# Patient Record
Sex: Male | Born: 1965 | Race: White | Hispanic: No | Marital: Married | Smoking: Current every day smoker
Health system: Southern US, Academic
[De-identification: ages and names within clinical notes are randomized; demographics above are authoritative.]

## PROBLEM LIST (undated history)

## (undated) DIAGNOSIS — I219 Acute myocardial infarction, unspecified: Secondary | ICD-10-CM

## (undated) DIAGNOSIS — I2510 Atherosclerotic heart disease of native coronary artery without angina pectoris: Secondary | ICD-10-CM

## (undated) DIAGNOSIS — Z973 Presence of spectacles and contact lenses: Secondary | ICD-10-CM

## (undated) DIAGNOSIS — J449 Chronic obstructive pulmonary disease, unspecified: Secondary | ICD-10-CM

## (undated) DIAGNOSIS — E7800 Pure hypercholesterolemia, unspecified: Secondary | ICD-10-CM

## (undated) HISTORY — PX: CORONARY ARTERY ANGIOPLASTY: PR CATH30428

## (undated) HISTORY — PX: HX TONSILLECTOMY: SHX27

## (undated) HISTORY — PX: HX ADENOIDECTOMY: SHX29

---

## 2015-06-06 ENCOUNTER — Encounter (HOSPITAL_BASED_OUTPATIENT_CLINIC_OR_DEPARTMENT_OTHER): Payer: Self-pay

## 2015-06-06 ENCOUNTER — Emergency Department (HOSPITAL_BASED_OUTPATIENT_CLINIC_OR_DEPARTMENT_OTHER)
Admission: EM | Admit: 2015-06-06 | Discharge: 2015-06-06 | Disposition: A | Discharge status: Home / Self Care | Payer: No Typology Code available for payment source | Attending: Emergency Medicine | Admitting: Emergency Medicine

## 2015-06-06 DIAGNOSIS — F17210 Nicotine dependence, cigarettes, uncomplicated: Secondary | ICD-10-CM | POA: Insufficient documentation

## 2015-06-06 DIAGNOSIS — R040 Epistaxis: Secondary | ICD-10-CM | POA: Insufficient documentation

## 2015-06-06 DIAGNOSIS — Z7982 Long term (current) use of aspirin: Secondary | ICD-10-CM | POA: Insufficient documentation

## 2015-06-06 DIAGNOSIS — I2510 Atherosclerotic heart disease of native coronary artery without angina pectoris: Secondary | ICD-10-CM | POA: Insufficient documentation

## 2015-06-06 DIAGNOSIS — I252 Old myocardial infarction: Secondary | ICD-10-CM | POA: Insufficient documentation

## 2015-06-06 HISTORY — DX: Acute myocardial infarction, unspecified (CMS HCC): I21.9

## 2015-06-06 HISTORY — DX: Atherosclerotic heart disease of native coronary artery without angina pectoris: I25.10

## 2015-06-06 HISTORY — DX: Presence of spectacles and contact lenses: Z97.3

## 2015-06-06 LAB — CBC WITH DIFF
BASOPHIL #: 0.1 x10ˆ3/uL (ref 0.00–0.10)
BASOPHIL %: 1 % (ref 0–3)
EOSINOPHIL #: 0.2 x10ˆ3/uL (ref 0.00–0.50)
EOSINOPHIL %: 2 % (ref 0–5)
HCT: 48.4 % (ref 40.0–50.0)
HGB: 16.6 g/dL (ref 13.5–18.0)
LYMPHOCYTE #: 2 x10ˆ3/uL (ref 1.00–4.80)
LYMPHOCYTE %: 22 % (ref 15–43)
MCH: 31.4 pg (ref 27.5–33.2)
MCHC: 34.3 g/dL (ref 32.0–36.0)
MCV: 91.5 fL (ref 82.0–97.0)
MONOCYTE #: 0.6 x10ˆ3/uL (ref 0.20–0.90)
MONOCYTE %: 6 % (ref 5–12)
MPV: 8.4 fL (ref 7.4–10.5)
NEUTROPHIL #: 6.2 x10?3/uL (ref 1.50–6.50)
NEUTROPHIL #: 6.2 x10ˆ3/uL (ref 1.50–6.50)
NEUTROPHIL %: 69 % (ref 43–76)
PLATELETS: 203 x10ˆ3/uL (ref 150–450)
RBC: 5.29 x10?6/uL (ref 4.40–5.80)
RBC: 5.29 x10ˆ6/uL (ref 4.40–5.80)
RDW: 13.1 % (ref 11.0–16.0)
WBC: 9 x10ˆ3/uL (ref 4.0–11.0)

## 2015-06-06 LAB — PT/INR
INR: 0.99
PROTHROMBIN TIME: 10.9 s (ref 9.4–12.5)

## 2015-06-06 LAB — PTT (PARTIAL THROMBOPLASTIN TIME): APTT: 33.9 s (ref 25.1–36.5)

## 2015-06-06 NOTE — ED Nurses Note (Signed)
Nose bleed since midnight, had one emesis of blood.

## 2015-06-06 NOTE — ED Nurses Note (Signed)
Patient discharged home with family.  AVS reviewed with patient/care giver.  A written copy of the AVS and discharge instructions was given to the patient/care giver.  Questions sufficiently answered as needed.  Patient/care giver encouraged to follow up with PCP as indicated.  In the event of an emergency, patient/care giver instructed to call 911 or go to the nearest emergency room.      Current Discharge Medication List      CONTINUE these medications - NO CHANGES were made during your visit.      Details   aspirin 81 mg Tablet, Delayed Release (E.C.)  Commonly known as:  ECOTRIN   81 mg, Oral, DAILY  Refills:  0

## 2015-06-06 NOTE — ED Provider Notes (Signed)
Collin Redden, PA-C  [facility]   Emergency Department  [address]  [place], [place] [address]    Date of service:  06/06/2015     CC:   Chief Complaint   Patient presents with    Epistaxis     Nose bleed since midnight, had one emesis of blood.         Date: 06/06/2015  Primary care provider: No Established Pcp  Means of arrival: private car  History obtained by: patient  History limited by: none    HPI  Collin Decker, date of birth [birth year],  is a 49 y.o. male who presents to the ED with c/o epistaxis.  Onset about midnight.  Intermittent since. Applied pressure.  States seems to be bleeding out of right side.  Associated blood running down back throat.  Did vomit blood about 6:00 AM.  No current nausea.  No additional vomiting.  No  dyspnea.  No known trauma.  No hx of prolonged bleeding.  Takes OTC ASA daily.  No PCP.  No prescribed meds.  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  Associated mild headache.    Denies dizziness, lightheadedness, chest pain, abdomen pain, difficulty breathing.  + smoker.         Review of Systems    The pertinent positives and negatives are as per HPI.  All other systems reviewed and are negative.    I reviewed and confirmed the patient's past medical history taken by the nurse or medical assistant  History:   Meds:   Current Outpatient Prescriptions   Medication Sig    aspirin (ECOTRIN) 81 mg Oral Tablet, Delayed Release (E.C.) Take 81 mg by mouth Once a day     PMH:    Past Medical History   Diagnosis Date    Wears glasses     Coronary artery disease     MI (myocardial infarction)          PSH:    Past Surgical History   Procedure Laterality Date    Coronary artery angioplasty      Hx tonsillectomy      Hx adenoidectomy           Social Hx:    History     Social History    Marital Status: Married     Spouse Name: N/A    Number of Children: N/A    Years of Education: N/A     Occupational History    Not on file.     Social History Main  Topics    Smoking status: Current Every Day Smoker -- 2.00 packs/day    Smokeless tobacco: Not on file    Alcohol Use: No    Drug Use: No    Sexual Activity: Not on file     Other Topics Concern    Not on file     Social History Narrative    No narrative on file     Family Hx: No family history on file.  Allergies: No Known Allergies    Above history reviewed with patient, changes are as documented.    Physical Exam   Nursing notes reviewed.    ED Triage Vitals   Enc Vitals Group      BP (Non-Invasive) 06/06/15 0814 149/88 mmHg      Heart Rate 06/06/15 0814 92      Respiratory Rate 06/06/15 0814 16      Temperature 06/06/15 0814  36.9 C (98.5 F)      Temp src --       SpO2-1 06/06/15 0814 96 %      Weight 06/06/15 0814 100.336 kg (221 lb 3.2 oz)      Height 06/06/15 0814 1.702 m (5\' 7" )      Head Cir --       Peak Flow --       Pain Score --       Pain Loc --       Pain Edu? --       Excl. in GC? --          Constitutional: NAD. Oriented.  Obese male  HEENT:   Head: Normocephalic and atraumatic.   Mouth/Throat: Oropharynx is clear and moist. Mild streaking of blood posterior pharynx.  No active drainage.    Ears: EAC and TM WNL  Nose: dried blood nares bilaterally.  No septal hematoma.  No signs of injury.  No active bleeding.    Eyes: EOMI, PERRL, no scleral icterus  Neck: Trachea midline. Neck supple. No adenopathy  Cardiovascular: RRR, No murmurs, rubs or gallops heard. Intact distal pulses. Cap refill < 2 secs  Pulmonary/Chest: BS equal bilaterally. No respiratory distress. No wheezes, rales or chest tenderness.   Abdominal: BS +. Abdomen soft, no tenderness, rebound or guarding.   No appreciable pulsatile mass            Musculoskeletal: No edema, tenderness or deformity.  Moves spine and all extremities well  Skin: warm and dry. No rash, erythema, pallor or cyanosis  Psychiatric: normal mood and affect. Behavior is normal. Normal insight.    Neurological:  Awake and alert. Normal facial symmetry and  speech.  Grossly intact.     Course  MDM      Plan:       Medical Records reviewed.  No signs of active bleeding.  Case discussed with Attending physician Dr. Herbie Drape   Will obtain the following labs/imaging    Orders Placed This Encounter    CBC/DIFF    PT/INR    PTT (PARTIAL THROMBOPLASTIN TIME)    CBC WITH DIFF       Results for orders placed or performed during the hospital encounter of 06/06/15 (from the past 12 hour(s))   PT/INR   Result Value Ref Range    PROTHROMBIN TIME 10.9 9.4-12.5 seconds    INR 0.99    PTT (PARTIAL THROMBOPLASTIN TIME)   Result Value Ref Range    APTT 33.9 25.1-36.5 seconds   CBC WITH DIFF   Result Value Ref Range    WBC 9.0 4.0-11.0 x103/uL    RBC 5.29 4.40-5.80 x106/uL    HGB 16.6 13.5-18.0 g/dL    HCT 16.1 09.6-04.5 %    MCV 91.5 82.0-97.0 fL    MCH 31.4 27.5-33.2 pg    MCHC 34.3 32.0-36.0 g/dL    RDW 40.9 81.1-91.4 %    PLATELETS 203 150-450 x103/uL    MPV 8.4 7.4-10.5 fL    NEUTROPHIL % 69 43-76 %    LYMPHOCYTE % 22 15-43 %    MONOCYTE % 6 5-12 %    EOSINOPHIL % 2 0-5 %    BASOPHIL % 1 0-3 %    NEUTROPHIL # 6.20 1.50-6.50 x103/uL    LYMPHOCYTE # 2.00 1.00-4.80 x103/uL    MONOCYTE # 0.60 0.20-0.90 x103/uL    EOSINOPHIL # 0.20 0.00-0.50 x103/uL    BASOPHIL # 0.10 0.00-0.10 x103/uL  All labs were reviewed.  No emergent/concerning findings    Radiographic Imaging:   n/a    Course:     Nursing notes reviewed      Filed Vitals:    06/06/15 0814 06/06/15 0930   BP: 149/88 125/77   Pulse: 92 84   Temp: 36.9 C (98.5 F)    Resp: 16 19   SpO2: 96% 95%     Re-exam - 9:30 AM -   Exam findings, tests and lab results discussed with patient.  No additional bleeding.  Denies n/v.  Feels fine overall.  All questions answered. Nose clamps and instructions provided for PRN use.  Pt is driving back to [place].      There are no emergent findings at this time.  The patient is deemed stable and suitable for discharge.      Discharge and medication instructions were discussed with the patient  and all questions were addressed.  The patient understands that they may return to the ED at any time for new or worsening symptoms.    Discharge Medication List as of 06/06/2015  9:40 AM          Clinical Impression:    1.  Acute epistaxis.      Disposition: Discharged  Condition  Stable     Follow up:   primary care physician in [facility]    Schedule an appointment as soon as possible for a visit  to establish care    [facility]  [facility]  [place] [address]  [phone]    If symptoms worsen

## 2015-06-06 NOTE — ED Nurses Note (Signed)
Specimen labeled at bedside, name and DOB verified by patient/family by inspecting labeled specimen. Specimen sent to LAB via tube system.

## 2017-03-07 ENCOUNTER — Emergency Department (HOSPITAL_COMMUNITY): Payer: Worker's Compensation

## 2017-03-07 ENCOUNTER — Encounter (HOSPITAL_COMMUNITY): Payer: Self-pay | Admitting: Emergency Medicine

## 2017-03-07 ENCOUNTER — Emergency Department (HOSPITAL_COMMUNITY)
Admission: EM | Admit: 2017-03-07 | Discharge: 2017-03-07 | Disposition: A | Discharge status: Home / Self Care | Payer: Worker's Compensation | Attending: Emergency Medicine | Admitting: Emergency Medicine

## 2017-03-07 DIAGNOSIS — I252 Old myocardial infarction: Secondary | ICD-10-CM | POA: Insufficient documentation

## 2017-03-07 DIAGNOSIS — Y939 Activity, unspecified: Secondary | ICD-10-CM | POA: Diagnosis not present

## 2017-03-07 DIAGNOSIS — S80211A Abrasion, right knee, initial encounter: Secondary | ICD-10-CM | POA: Insufficient documentation

## 2017-03-07 DIAGNOSIS — J449 Chronic obstructive pulmonary disease, unspecified: Secondary | ICD-10-CM | POA: Diagnosis not present

## 2017-03-07 DIAGNOSIS — Y999 Unspecified external cause status: Secondary | ICD-10-CM | POA: Insufficient documentation

## 2017-03-07 DIAGNOSIS — S50311A Abrasion of right elbow, initial encounter: Secondary | ICD-10-CM | POA: Insufficient documentation

## 2017-03-07 DIAGNOSIS — Z79899 Other long term (current) drug therapy: Secondary | ICD-10-CM | POA: Diagnosis not present

## 2017-03-07 DIAGNOSIS — S0081XA Abrasion of other part of head, initial encounter: Secondary | ICD-10-CM | POA: Insufficient documentation

## 2017-03-07 DIAGNOSIS — Z87891 Personal history of nicotine dependence: Secondary | ICD-10-CM | POA: Diagnosis not present

## 2017-03-07 DIAGNOSIS — S0990XA Unspecified injury of head, initial encounter: Secondary | ICD-10-CM | POA: Diagnosis present

## 2017-03-07 DIAGNOSIS — Y92410 Unspecified street and highway as the place of occurrence of the external cause: Secondary | ICD-10-CM | POA: Insufficient documentation

## 2017-03-07 HISTORY — DX: Chronic obstructive pulmonary disease, unspecified: J44.9

## 2017-03-07 HISTORY — DX: Pure hypercholesterolemia, unspecified: E78.00

## 2017-03-07 HISTORY — DX: Acute myocardial infarction, unspecified: I21.9

## 2017-03-07 LAB — CBC WITH DIFFERENTIAL/PLATELET
BASOS PCT: 0 %
Basophils Absolute: 0 10*3/uL (ref 0.0–0.1)
EOS ABS: 0.2 10*3/uL (ref 0.0–0.7)
EOS PCT: 3 %
HEMATOCRIT: 46.7 % (ref 39.0–52.0)
HEMOGLOBIN: 15.3 g/dL (ref 13.0–17.0)
LYMPHS ABS: 1.7 10*3/uL (ref 0.7–4.0)
Lymphocytes Relative: 27 %
MCH: 30.1 pg (ref 26.0–34.0)
MCHC: 32.8 g/dL (ref 30.0–36.0)
MCV: 91.7 fL (ref 78.0–100.0)
Monocytes Absolute: 0.4 10*3/uL (ref 0.1–1.0)
Monocytes Relative: 6 %
NEUTROS PCT: 64 %
Neutro Abs: 3.9 10*3/uL (ref 1.7–7.7)
PLATELETS: 196 10*3/uL (ref 150–400)
RBC: 5.09 MIL/uL (ref 4.22–5.81)
RDW: 13.1 % (ref 11.5–15.5)
WBC: 6.1 10*3/uL (ref 4.0–10.5)

## 2017-03-07 LAB — BASIC METABOLIC PANEL
Anion gap: 11 (ref 5–15)
BUN: 14 mg/dL (ref 6–20)
CALCIUM: 9 mg/dL (ref 8.9–10.3)
CO2: 23 mmol/L (ref 22–32)
CREATININE: 1.21 mg/dL (ref 0.61–1.24)
Chloride: 102 mmol/L (ref 101–111)
GFR calc Af Amer: >60 mL/min (ref >60)
GFR calc non Af Amer: >60 mL/min (ref >60)
GLUCOSE: 124 mg/dL — AB (ref 65–99)
Potassium: 3.9 mmol/L (ref 3.5–5.1)
Sodium: 136 mmol/L (ref 135–145)

## 2017-03-07 MED ORDER — MORPHINE SULFATE (PF) 4 MG/ML IV SOLN
6.0000 mg | Freq: Once | INTRAVENOUS | Status: AC
  Administered 2017-03-07: 6 mg via INTRAVENOUS
  Filled 2017-03-07: qty 2

## 2017-03-07 MED ORDER — SODIUM CHLORIDE 0.9 % IV BOLUS (SEPSIS)
1000.0000 mL | Freq: Once | INTRAVENOUS | Status: AC
  Administered 2017-03-07: 1000 mL via INTRAVENOUS
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
## 2017-03-07 NOTE — ED Notes (Signed)
No questions or concerns

## 2017-03-07 NOTE — ED Notes (Signed)
Pt transported to radiology on telel.

## 2017-03-07 NOTE — ED Notes (Signed)
Pt reports he feels "good enough to go home"
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
## 2020-08-04 DEATH — deceased
